# Patient Record
Sex: Female | Born: 1950 | Race: White | Hispanic: No | Marital: Married | Smoking: Never smoker
Health system: Southern US, Community
[De-identification: ages and names within clinical notes are randomized; demographics above are authoritative.]

## PROBLEM LIST (undated history)

## (undated) DIAGNOSIS — C4491 Basal cell carcinoma of skin, unspecified: Secondary | ICD-10-CM

## (undated) DIAGNOSIS — N809 Endometriosis, unspecified: Secondary | ICD-10-CM

## (undated) DIAGNOSIS — E079 Disorder of thyroid, unspecified: Secondary | ICD-10-CM

## (undated) HISTORY — PX: UMBILICAL HERNIA REPAIR: SHX196

## (undated) HISTORY — DX: Endometriosis, unspecified: N80.9

## (undated) HISTORY — PX: LAPAROSCOPY: SHX197

## (undated) HISTORY — DX: Disorder of thyroid, unspecified: E07.9

## (undated) HISTORY — PX: TOTAL ABDOMINAL HYSTERECTOMY: SHX209

## (undated) HISTORY — DX: Basal cell carcinoma of skin, unspecified: C44.91

---

## 2015-08-07 DIAGNOSIS — Z299 Encounter for prophylactic measures, unspecified: Secondary | ICD-10-CM | POA: Diagnosis not present

## 2015-08-07 DIAGNOSIS — J329 Chronic sinusitis, unspecified: Secondary | ICD-10-CM | POA: Diagnosis not present

## 2015-08-07 DIAGNOSIS — Z789 Other specified health status: Secondary | ICD-10-CM | POA: Diagnosis not present

## 2015-08-16 DIAGNOSIS — M810 Age-related osteoporosis without current pathological fracture: Secondary | ICD-10-CM | POA: Diagnosis not present

## 2015-10-26 DIAGNOSIS — D2271 Melanocytic nevi of right lower limb, including hip: Secondary | ICD-10-CM | POA: Diagnosis not present

## 2015-10-26 DIAGNOSIS — N952 Postmenopausal atrophic vaginitis: Secondary | ICD-10-CM | POA: Diagnosis not present

## 2015-10-26 DIAGNOSIS — Z08 Encounter for follow-up examination after completed treatment for malignant neoplasm: Secondary | ICD-10-CM | POA: Diagnosis not present

## 2015-10-26 DIAGNOSIS — I8390 Asymptomatic varicose veins of unspecified lower extremity: Secondary | ICD-10-CM | POA: Diagnosis not present

## 2015-10-26 DIAGNOSIS — D225 Melanocytic nevi of trunk: Secondary | ICD-10-CM | POA: Diagnosis not present

## 2015-10-26 DIAGNOSIS — Z85828 Personal history of other malignant neoplasm of skin: Secondary | ICD-10-CM | POA: Diagnosis not present

## 2015-10-26 DIAGNOSIS — D2261 Melanocytic nevi of right upper limb, including shoulder: Secondary | ICD-10-CM | POA: Diagnosis not present

## 2015-10-26 DIAGNOSIS — L821 Other seborrheic keratosis: Secondary | ICD-10-CM | POA: Diagnosis not present

## 2015-10-26 DIAGNOSIS — D2272 Melanocytic nevi of left lower limb, including hip: Secondary | ICD-10-CM | POA: Diagnosis not present

## 2015-10-26 DIAGNOSIS — M810 Age-related osteoporosis without current pathological fracture: Secondary | ICD-10-CM | POA: Diagnosis not present

## 2015-10-26 DIAGNOSIS — D2262 Melanocytic nevi of left upper limb, including shoulder: Secondary | ICD-10-CM | POA: Diagnosis not present

## 2015-10-26 DIAGNOSIS — Z1231 Encounter for screening mammogram for malignant neoplasm of breast: Secondary | ICD-10-CM | POA: Diagnosis not present

## 2015-10-26 DIAGNOSIS — L814 Other melanin hyperpigmentation: Secondary | ICD-10-CM | POA: Diagnosis not present

## 2015-11-22 DIAGNOSIS — J329 Chronic sinusitis, unspecified: Secondary | ICD-10-CM | POA: Diagnosis not present

## 2015-11-22 DIAGNOSIS — Z299 Encounter for prophylactic measures, unspecified: Secondary | ICD-10-CM | POA: Diagnosis not present

## 2015-11-22 DIAGNOSIS — Z6822 Body mass index (BMI) 22.0-22.9, adult: Secondary | ICD-10-CM | POA: Diagnosis not present

## 2015-11-22 DIAGNOSIS — Z713 Dietary counseling and surveillance: Secondary | ICD-10-CM | POA: Diagnosis not present

## 2015-12-04 DIAGNOSIS — Z1211 Encounter for screening for malignant neoplasm of colon: Secondary | ICD-10-CM | POA: Diagnosis not present

## 2015-12-04 DIAGNOSIS — Z0000 Encounter for general adult medical examination without abnormal findings: Secondary | ICD-10-CM | POA: Diagnosis not present

## 2015-12-04 DIAGNOSIS — Z7189 Other specified counseling: Secondary | ICD-10-CM | POA: Diagnosis not present

## 2015-12-05 DIAGNOSIS — E7800 Pure hypercholesterolemia, unspecified: Secondary | ICD-10-CM | POA: Diagnosis not present

## 2015-12-05 DIAGNOSIS — Z79899 Other long term (current) drug therapy: Secondary | ICD-10-CM | POA: Diagnosis not present

## 2015-12-05 DIAGNOSIS — M810 Age-related osteoporosis without current pathological fracture: Secondary | ICD-10-CM | POA: Diagnosis not present

## 2015-12-05 DIAGNOSIS — R5383 Other fatigue: Secondary | ICD-10-CM | POA: Diagnosis not present

## 2015-12-05 DIAGNOSIS — E039 Hypothyroidism, unspecified: Secondary | ICD-10-CM | POA: Diagnosis not present

## 2016-01-18 DIAGNOSIS — M810 Age-related osteoporosis without current pathological fracture: Secondary | ICD-10-CM | POA: Diagnosis not present

## 2016-03-25 DIAGNOSIS — E039 Hypothyroidism, unspecified: Secondary | ICD-10-CM | POA: Diagnosis not present

## 2016-05-17 DIAGNOSIS — Z299 Encounter for prophylactic measures, unspecified: Secondary | ICD-10-CM | POA: Diagnosis not present

## 2016-05-17 DIAGNOSIS — Z713 Dietary counseling and surveillance: Secondary | ICD-10-CM | POA: Diagnosis not present

## 2016-05-17 DIAGNOSIS — Z6823 Body mass index (BMI) 23.0-23.9, adult: Secondary | ICD-10-CM | POA: Diagnosis not present

## 2016-05-17 DIAGNOSIS — L259 Unspecified contact dermatitis, unspecified cause: Secondary | ICD-10-CM | POA: Diagnosis not present

## 2016-07-22 DIAGNOSIS — M810 Age-related osteoporosis without current pathological fracture: Secondary | ICD-10-CM | POA: Diagnosis not present

## 2016-08-06 DIAGNOSIS — H01002 Unspecified blepharitis right lower eyelid: Secondary | ICD-10-CM | POA: Diagnosis not present

## 2016-08-06 DIAGNOSIS — H04121 Dry eye syndrome of right lacrimal gland: Secondary | ICD-10-CM | POA: Diagnosis not present

## 2016-08-06 DIAGNOSIS — H00021 Hordeolum internum right upper eyelid: Secondary | ICD-10-CM | POA: Diagnosis not present

## 2016-08-06 DIAGNOSIS — H00022 Hordeolum internum right lower eyelid: Secondary | ICD-10-CM | POA: Diagnosis not present

## 2016-08-06 DIAGNOSIS — H02052 Trichiasis without entropian right lower eyelid: Secondary | ICD-10-CM | POA: Diagnosis not present

## 2016-08-06 DIAGNOSIS — H01001 Unspecified blepharitis right upper eyelid: Secondary | ICD-10-CM | POA: Diagnosis not present

## 2016-08-12 DIAGNOSIS — H0011 Chalazion right upper eyelid: Secondary | ICD-10-CM | POA: Diagnosis not present

## 2016-08-12 DIAGNOSIS — H04123 Dry eye syndrome of bilateral lacrimal glands: Secondary | ICD-10-CM | POA: Diagnosis not present

## 2016-08-12 DIAGNOSIS — H01001 Unspecified blepharitis right upper eyelid: Secondary | ICD-10-CM | POA: Diagnosis not present

## 2016-08-12 DIAGNOSIS — H02052 Trichiasis without entropian right lower eyelid: Secondary | ICD-10-CM | POA: Diagnosis not present

## 2016-08-12 DIAGNOSIS — H01002 Unspecified blepharitis right lower eyelid: Secondary | ICD-10-CM | POA: Diagnosis not present

## 2016-08-14 DIAGNOSIS — H01009 Unspecified blepharitis unspecified eye, unspecified eyelid: Secondary | ICD-10-CM | POA: Diagnosis not present

## 2016-08-14 DIAGNOSIS — H18899 Other specified disorders of cornea, unspecified eye: Secondary | ICD-10-CM | POA: Diagnosis not present

## 2016-08-14 DIAGNOSIS — H04123 Dry eye syndrome of bilateral lacrimal glands: Secondary | ICD-10-CM | POA: Diagnosis not present

## 2016-08-23 DIAGNOSIS — H04123 Dry eye syndrome of bilateral lacrimal glands: Secondary | ICD-10-CM | POA: Diagnosis not present

## 2016-08-23 DIAGNOSIS — H18899 Other specified disorders of cornea, unspecified eye: Secondary | ICD-10-CM | POA: Diagnosis not present

## 2016-08-23 DIAGNOSIS — H01009 Unspecified blepharitis unspecified eye, unspecified eyelid: Secondary | ICD-10-CM | POA: Diagnosis not present

## 2016-09-23 DIAGNOSIS — H01009 Unspecified blepharitis unspecified eye, unspecified eyelid: Secondary | ICD-10-CM | POA: Diagnosis not present

## 2016-09-23 DIAGNOSIS — H04123 Dry eye syndrome of bilateral lacrimal glands: Secondary | ICD-10-CM | POA: Diagnosis not present

## 2016-10-15 DIAGNOSIS — H029 Unspecified disorder of eyelid: Secondary | ICD-10-CM | POA: Diagnosis not present

## 2016-10-15 DIAGNOSIS — H01002 Unspecified blepharitis right lower eyelid: Secondary | ICD-10-CM | POA: Diagnosis not present

## 2016-10-15 DIAGNOSIS — H02052 Trichiasis without entropian right lower eyelid: Secondary | ICD-10-CM | POA: Diagnosis not present

## 2016-11-18 DIAGNOSIS — H01002 Unspecified blepharitis right lower eyelid: Secondary | ICD-10-CM | POA: Diagnosis not present

## 2016-11-18 DIAGNOSIS — H029 Unspecified disorder of eyelid: Secondary | ICD-10-CM | POA: Diagnosis not present

## 2016-11-18 DIAGNOSIS — H04123 Dry eye syndrome of bilateral lacrimal glands: Secondary | ICD-10-CM | POA: Diagnosis not present

## 2016-12-13 DIAGNOSIS — N952 Postmenopausal atrophic vaginitis: Secondary | ICD-10-CM | POA: Diagnosis not present

## 2016-12-13 DIAGNOSIS — Z90711 Acquired absence of uterus with remaining cervical stump: Secondary | ICD-10-CM | POA: Diagnosis not present

## 2016-12-13 DIAGNOSIS — Z803 Family history of malignant neoplasm of breast: Secondary | ICD-10-CM | POA: Diagnosis not present

## 2016-12-13 DIAGNOSIS — Z124 Encounter for screening for malignant neoplasm of cervix: Secondary | ICD-10-CM | POA: Diagnosis not present

## 2016-12-13 DIAGNOSIS — Z1231 Encounter for screening mammogram for malignant neoplasm of breast: Secondary | ICD-10-CM | POA: Diagnosis not present

## 2016-12-13 DIAGNOSIS — Z01419 Encounter for gynecological examination (general) (routine) without abnormal findings: Secondary | ICD-10-CM | POA: Diagnosis not present

## 2016-12-20 DIAGNOSIS — Z79899 Other long term (current) drug therapy: Secondary | ICD-10-CM | POA: Diagnosis not present

## 2016-12-20 DIAGNOSIS — Z7189 Other specified counseling: Secondary | ICD-10-CM | POA: Diagnosis not present

## 2016-12-20 DIAGNOSIS — Z1211 Encounter for screening for malignant neoplasm of colon: Secondary | ICD-10-CM | POA: Diagnosis not present

## 2016-12-20 DIAGNOSIS — Z6823 Body mass index (BMI) 23.0-23.9, adult: Secondary | ICD-10-CM | POA: Diagnosis not present

## 2016-12-20 DIAGNOSIS — Z789 Other specified health status: Secondary | ICD-10-CM | POA: Diagnosis not present

## 2016-12-20 DIAGNOSIS — Z1339 Encounter for screening examination for other mental health and behavioral disorders: Secondary | ICD-10-CM | POA: Diagnosis not present

## 2016-12-20 DIAGNOSIS — Z90710 Acquired absence of both cervix and uterus: Secondary | ICD-10-CM | POA: Diagnosis not present

## 2016-12-20 DIAGNOSIS — E039 Hypothyroidism, unspecified: Secondary | ICD-10-CM | POA: Diagnosis not present

## 2016-12-20 DIAGNOSIS — Z299 Encounter for prophylactic measures, unspecified: Secondary | ICD-10-CM | POA: Diagnosis not present

## 2016-12-20 DIAGNOSIS — Z0000 Encounter for general adult medical examination without abnormal findings: Secondary | ICD-10-CM | POA: Diagnosis not present

## 2016-12-20 DIAGNOSIS — Z1331 Encounter for screening for depression: Secondary | ICD-10-CM | POA: Diagnosis not present

## 2016-12-21 DIAGNOSIS — Z23 Encounter for immunization: Secondary | ICD-10-CM | POA: Diagnosis not present

## 2016-12-23 DIAGNOSIS — D2261 Melanocytic nevi of right upper limb, including shoulder: Secondary | ICD-10-CM | POA: Diagnosis not present

## 2016-12-23 DIAGNOSIS — D225 Melanocytic nevi of trunk: Secondary | ICD-10-CM | POA: Diagnosis not present

## 2016-12-23 DIAGNOSIS — D2271 Melanocytic nevi of right lower limb, including hip: Secondary | ICD-10-CM | POA: Diagnosis not present

## 2016-12-23 DIAGNOSIS — B351 Tinea unguium: Secondary | ICD-10-CM | POA: Diagnosis not present

## 2016-12-23 DIAGNOSIS — Z08 Encounter for follow-up examination after completed treatment for malignant neoplasm: Secondary | ICD-10-CM | POA: Diagnosis not present

## 2016-12-23 DIAGNOSIS — Z85828 Personal history of other malignant neoplasm of skin: Secondary | ICD-10-CM | POA: Diagnosis not present

## 2016-12-23 DIAGNOSIS — D2262 Melanocytic nevi of left upper limb, including shoulder: Secondary | ICD-10-CM | POA: Diagnosis not present

## 2016-12-23 DIAGNOSIS — L814 Other melanin hyperpigmentation: Secondary | ICD-10-CM | POA: Diagnosis not present

## 2016-12-23 DIAGNOSIS — L821 Other seborrheic keratosis: Secondary | ICD-10-CM | POA: Diagnosis not present

## 2016-12-23 DIAGNOSIS — D2272 Melanocytic nevi of left lower limb, including hip: Secondary | ICD-10-CM | POA: Diagnosis not present

## 2017-01-23 DIAGNOSIS — M810 Age-related osteoporosis without current pathological fracture: Secondary | ICD-10-CM | POA: Diagnosis not present

## 2017-02-11 DIAGNOSIS — L718 Other rosacea: Secondary | ICD-10-CM | POA: Diagnosis not present

## 2017-02-11 DIAGNOSIS — L738 Other specified follicular disorders: Secondary | ICD-10-CM | POA: Diagnosis not present

## 2017-08-04 DIAGNOSIS — M810 Age-related osteoporosis without current pathological fracture: Secondary | ICD-10-CM | POA: Diagnosis not present

## 2017-08-15 DIAGNOSIS — E039 Hypothyroidism, unspecified: Secondary | ICD-10-CM | POA: Diagnosis not present

## 2017-08-15 DIAGNOSIS — T1490XA Injury, unspecified, initial encounter: Secondary | ICD-10-CM | POA: Diagnosis not present

## 2017-08-15 DIAGNOSIS — M25572 Pain in left ankle and joints of left foot: Secondary | ICD-10-CM | POA: Diagnosis not present

## 2017-08-15 DIAGNOSIS — S99912A Unspecified injury of left ankle, initial encounter: Secondary | ICD-10-CM | POA: Diagnosis not present

## 2017-08-15 DIAGNOSIS — Z299 Encounter for prophylactic measures, unspecified: Secondary | ICD-10-CM | POA: Diagnosis not present

## 2017-08-15 DIAGNOSIS — R0781 Pleurodynia: Secondary | ICD-10-CM | POA: Diagnosis not present

## 2017-08-15 DIAGNOSIS — Z6824 Body mass index (BMI) 24.0-24.9, adult: Secondary | ICD-10-CM | POA: Diagnosis not present

## 2017-08-15 DIAGNOSIS — M79672 Pain in left foot: Secondary | ICD-10-CM | POA: Diagnosis not present

## 2017-09-01 DIAGNOSIS — M9902 Segmental and somatic dysfunction of thoracic region: Secondary | ICD-10-CM | POA: Diagnosis not present

## 2017-09-01 DIAGNOSIS — S233XXA Sprain of ligaments of thoracic spine, initial encounter: Secondary | ICD-10-CM | POA: Diagnosis not present

## 2017-09-01 DIAGNOSIS — S2341XA Sprain of ribs, initial encounter: Secondary | ICD-10-CM | POA: Diagnosis not present

## 2017-09-03 DIAGNOSIS — S2341XA Sprain of ribs, initial encounter: Secondary | ICD-10-CM | POA: Diagnosis not present

## 2017-09-03 DIAGNOSIS — M9902 Segmental and somatic dysfunction of thoracic region: Secondary | ICD-10-CM | POA: Diagnosis not present

## 2017-09-03 DIAGNOSIS — S233XXA Sprain of ligaments of thoracic spine, initial encounter: Secondary | ICD-10-CM | POA: Diagnosis not present

## 2017-09-04 DIAGNOSIS — S2341XA Sprain of ribs, initial encounter: Secondary | ICD-10-CM | POA: Diagnosis not present

## 2017-09-04 DIAGNOSIS — S233XXA Sprain of ligaments of thoracic spine, initial encounter: Secondary | ICD-10-CM | POA: Diagnosis not present

## 2017-09-04 DIAGNOSIS — M9902 Segmental and somatic dysfunction of thoracic region: Secondary | ICD-10-CM | POA: Diagnosis not present

## 2017-09-12 ENCOUNTER — Ambulatory Visit (HOSPITAL_BASED_OUTPATIENT_CLINIC_OR_DEPARTMENT_OTHER)
Admission: RE | Admit: 2017-09-12 | Discharge: 2017-09-12 | Disposition: A | Discharge status: Home / Self Care | Payer: Medicare Other | Source: Ambulatory Visit | Attending: Family Medicine | Admitting: Family Medicine

## 2017-09-12 ENCOUNTER — Encounter: Payer: Self-pay | Admitting: Family Medicine

## 2017-09-12 ENCOUNTER — Ambulatory Visit (INDEPENDENT_AMBULATORY_CARE_PROVIDER_SITE_OTHER): Payer: Medicare Other | Admitting: Family Medicine

## 2017-09-12 VITALS — BP 118/77 | HR 71 | Wt 140.0 lb

## 2017-09-12 DIAGNOSIS — M79672 Pain in left foot: Secondary | ICD-10-CM | POA: Diagnosis not present

## 2017-09-12 DIAGNOSIS — M7989 Other specified soft tissue disorders: Secondary | ICD-10-CM | POA: Insufficient documentation

## 2017-09-12 MED ORDER — DICLOFENAC SODIUM 1 % TD GEL: 2.0000 g | Freq: Four times a day (QID) | TRANSDERMAL | 11 refills | Status: AC

## 2017-09-12 NOTE — Progress Notes (Addendum)
Subjective:    CC: foot swelling  HPI: Julanne notes a 6-week history of left foot and ankle swelling and pain.  She has a history of left plantar heel pain several years ago that was thought to be plantar fasciitis.  This resolved with home exercises and orthotics.  She notes that 6 weeks ago she started having some pain in the plantar aspect of her calcaneus somewhat consistent with plantar fasciitis however she additionally developed ankle and midfoot swelling dorsally midfoot and both medially and laterally into the ankle.  She notes this is mildly tender and not consistent with prior episodes of plantar fasciitis.  She denies any injury or significant change in her activity.  She is had some work-up for this already with a normal-appearing x-ray obtained by her PCP at [facility] on August 9.  Additionally she is had some trials of over-the-counter medications which have not helped much.  Additionally her PCP obtained a reportedly normal uric acid.  She denies fevers chills nausea vomiting or diarrhea.  Past medical history, Surgical history, Family history not pertinant except as noted below, Social history, Allergies, and medications have been entered into the medical record, reviewed, and no changes needed.   Review of Systems: No headache, visual changes, nausea, vomiting, diarrhea, constipation, dizziness, abdominal pain, skin rash, fevers, chills, night sweats, weight loss, swollen lymph nodes, body aches, joint swelling, muscle aches, chest pain, shortness of breath, mood changes, visual or auditory hallucinations.   Objective:    Vitals:   09/12/17 1045  BP: 118/77  Pulse: 71   General: Well Developed, well nourished, and in no acute distress.  Neuro/Psych: Alert and oriented x3, extra-ocular muscles intact, able to move all 4 extremities, sensation grossly intact. Skin: Warm and dry, no rashes noted.  Respiratory: Not using accessory muscles, speaking in full  sentences, trachea midline.  Cardiovascular: Pulses palpable, no extremity edema. Abdomen: Does not appear distended. MSK: Left foot and ankle mildly swollen especially at the medial lateral aspect of the midfoot and ankle. Foot and ankle motion are normal. Stable ligaments exam. Mildly tender to palpation plantar calcaneus.  Additionally mildly tender to palpation at the dorsal and lateral midfoot and at the medial ankle. Pulses capillary refill and sensation are intact distally.  Lab and Radiology Results X-ray report from outside hospital reviewed.      Limited musculoskeletal ultrasound of the left foot reveals a moderate ankle effusion as well as edema with hypoechoic fluid marbling and tracking along the subcutaneous tissue especially at the medial ankle.  Posterior tibialis tendons peroneal tendons and Achilles tendon are normal-appearing on ultrasound. Normal bony structures.   Impression and Recommendations:    Assessment and Plan: 67 y.o. female with  Left foot pain and swelling.  Etiology is somewhat unclear.  X-rays were reportedly normal.  DVT is an obvious possibility and will proceed with duplex ultrasound to evaluate for DVT.  Additionally as his symptoms now have been ongoing for 6 weeks and failing conservative management with initially negative work-up I think is reasonable to proceed with MRI at this point further characterize her ankle effusion and pain.  I am concerned for an osteochondral lesion or a possible radiographically occult fracture.  Follow-up after MRI.  Additionally will treat with diclofenac gel and ankle compression sleeve..   CC:  Nicanor Bake, NP [facility] [place], [place] [address] [phone] Fax: [phone]   Orders Placed This Encounter  Procedures  . US Venous Img  Lower Unilateral Left    Standing Status:   Future    Standing Expiration Date:   11/13/2018    Order Specific Question:   Reason for Exam  (SYMPTOM  OR DIAGNOSIS REQUIRED)    Answer:   eval leg swelling    Order Specific Question:   Preferred imaging location?    Answer:   Designer, multimedia  . MR FOOT LEFT WO CONTRAST    Standing Status:   Future    Standing Expiration Date:   11/13/2018    Order Specific Question:   What is the patient's sedation requirement?    Answer:   No Sedation    Order Specific Question:   Does the patient have a pacemaker or implanted devices?    Answer:   No    Order Specific Question:   Preferred imaging location?    Answer:   Product/process development scientist (table limit-350lbs)    Order Specific Question:   Radiology Contrast Protocol - do NOT remove file path    Answer:   [email or web address]  . US Venous Img Lower Unilateral Left    Standing Status:   Future    Standing Expiration Date:   11/13/2018    Scheduling Instructions:     [facility] hospital    Order Specific Question:   Reason for Exam (SYMPTOM  OR DIAGNOSIS REQUIRED)    Answer:   eval leg swelling    Order Specific Question:   Preferred imaging location?    Answer:   External    Order Specific Question:   Call Results- Best Contact Number?    Answer:   Fax results to Dr Georgina Snell [phone]   Meds ordered this encounter  Medications  . diclofenac sodium (VOLTAREN) 1 % GEL    Sig: Apply 2 g topically 4 (four) times daily. To affected joint.    Dispense:  100 g    Refill:  11    Discussed warning signs or symptoms. Please see discharge instructions. Patient expresses understanding.

## 2017-09-12 NOTE — Progress Notes (Signed)
Faxed and confirmation received.

## 2017-09-12 NOTE — Patient Instructions (Signed)
Thank you for coming in today. Schedule Ultrasound Call [phone]  You should hear about MRI soon.   Use body helix full ankle compression sleeve.   Use diclofenac gel.    Follow up after MRI

## 2017-09-15 ENCOUNTER — Ambulatory Visit (INDEPENDENT_AMBULATORY_CARE_PROVIDER_SITE_OTHER): Payer: Medicare Other

## 2017-09-15 DIAGNOSIS — M7672 Peroneal tendinitis, left leg: Secondary | ICD-10-CM | POA: Diagnosis not present

## 2017-09-15 DIAGNOSIS — R600 Localized edema: Secondary | ICD-10-CM | POA: Diagnosis not present

## 2017-09-15 DIAGNOSIS — M722 Plantar fascial fibromatosis: Secondary | ICD-10-CM | POA: Diagnosis not present

## 2017-09-15 DIAGNOSIS — M7989 Other specified soft tissue disorders: Secondary | ICD-10-CM

## 2017-09-15 DIAGNOSIS — M7662 Achilles tendinitis, left leg: Secondary | ICD-10-CM

## 2017-09-17 ENCOUNTER — Ambulatory Visit (INDEPENDENT_AMBULATORY_CARE_PROVIDER_SITE_OTHER): Payer: Medicare Other | Admitting: Family Medicine

## 2017-09-17 ENCOUNTER — Encounter: Payer: Self-pay | Admitting: Family Medicine

## 2017-09-17 VITALS — BP 131/77 | HR 72 | Ht 63.0 in | Wt 141.0 lb

## 2017-09-17 DIAGNOSIS — M79672 Pain in left foot: Secondary | ICD-10-CM | POA: Diagnosis not present

## 2017-09-17 DIAGNOSIS — M7989 Other specified soft tissue disorders: Secondary | ICD-10-CM | POA: Diagnosis not present

## 2017-09-17 NOTE — Progress Notes (Signed)
Christine Wolfe is a 67 y.o. female who presents to [place] today for follow-up left foot and leg swelling.  Patient was seen on September 6 for left ankle and leg swelling.  At that point she was failing conservative management and we proceeded with noncontrast MRI and duplex ultrasound to evaluate for possible DVT.  Fortunately the ultrasound was negative.  The MRI showed severe plantar fasciitis with either complete tear or nearly complete tear.  Additionally patient had mild peroneal tendinitis with tiny longitudinal split tear and mild Achilles tendinitis.  Fortunately she did not have stress fractures fractures OCD lesion or other significant findings.  She notes that initially she did have quite a bit of plantar heel pain however that largely resolved in the last few weeks.  She continues to have diffuse medial and lateral ankle pain as well as dorsal midfoot pain and swelling.  She notes this is mild to moderate.  She was prescribed diclofenac gel which has been somewhat helpful.  Additionally she was recommended to use a compression ankle sleeve which she is ordered but not yet got.    ROS:  As above  Exam:  BP 131/77   Pulse 72   Ht 5\' 3"  (1.6 m)   Wt 141 lb (64 kg)   BMI 24.98 kg/m  General: Well Developed, well nourished, and in no acute distress.  Neuro/Psych: Alert and oriented x3, extra-ocular muscles intact, able to move all 4 extremities, sensation grossly intact. Skin: Warm and dry, no rashes noted.  Respiratory: Not using accessory muscles, speaking in full sentences, trachea midline.  Cardiovascular: Pulses palpable, no extremity edema. Abdomen: Does not appear distended. MSK:  Left foot and ankle: Slightly swollen at the lateral medial ankle as well as the dorsal midfoot. Ankle and foot motion are intact and normal. Stable ligamentous exam. Mildly tender to palpation at the medial to lateral ankle anteriorly.  Not  particularly tender at the posterior aspect of the lateral malleolus at the peroneal tendon.  Additionally not particularly tender at the posterior calcaneus at the Achilles tendon. Also not very tender at the plantar calcaneus at the origin of the plantar fascia. Pulses capillary refill and sensation are intact distally.    Lab and Radiology Results No results found for this or any previous visit (from the past 72 hour(s)). Mr Foot Left Wo Contrast  Result Date: 09/15/2017 CLINICAL DATA:  Left foot pain. Normal range of motion. Swelling of the ankle and midfoot since July. EXAM: MRI OF THE LEFT FOOT WITHOUT CONTRAST TECHNIQUE: Multiplanar, multisequence MR imaging of the left foot was performed. No intravenous contrast was administered. COMPARISON:  None. FINDINGS: TENDONS Peroneal: Peroneal longus tendon intact. Mild tendinosis of the peroneus brevis with a longitudinal split tear. Posteromedial: Posterior tibial tendon intact. Flexor hallucis longus tendon intact. Flexor digitorum longus tendon intact. Anterior: Tibialis anterior tendon intact. Extensor hallucis longus tendon intact Extensor digitorum longus tendon intact. Achilles: Minimal tendinosis of the distal medial Achilles tendon without a tear. Severe soft tissue edema in Kager's fat. Plantar Fascia: Severe thickening of the medial band of the plantar fascia adjacent to the calcaneal insertion with subcortical reactive marrow edema. Large high-grade partial thickness versus complete tear of the medial band of the plantar fascia adjacent to the calcaneal insertion. Mild adjacent soft tissue edema. Mild edema in the flexor digitorum brevis muscle. LIGAMENTS Lateral: Anterior talofibular ligament intact. Calcaneofibular ligament intact. Posterior talofibular ligament intact. Anterior and posterior tibiofibular ligaments intact. Medial:  Deltoid ligament intact. Spring ligament intact. CARTILAGE Ankle Joint: No joint effusion. Normal ankle mortise.  No chondral defect. Subtalar Joints/Sinus Tarsi: Normal subtalar joints. No subtalar joint effusion. Normal sinus tarsi. Bones: No marrow signal abnormality.  No fracture or dislocation. Soft Tissue: No fluid collection or hematoma. Remainder of the muscles are normal. IMPRESSION: 1. Severe plantar fasciitis involving the medial band of the plantar fascia with a large high-grade partial versus complete tear at the calcaneal insertion. Mild subcortical marrow edema at the calcaneal insertion. 2. Mild tendinosis of the peroneus brevis with a longitudinal split tear. 3. Minimal tendinosis of the distal medial Achilles tendon without a tear. Electronically Signed   By: Kathreen Devoid   On: 09/15/2017 11:06    I personally (independently) visualized and performed the interpretation of the images attached in this note.  EXAM: LEFT LOWER EXTREMITY VENOUS DOPPLER ULTRASOUND  TECHNIQUE: Gray-scale sonography with graded compression, as well as color Doppler and duplex ultrasound were performed to evaluate the lower extremity deep venous systems from the level of the common femoral vein and including the common femoral, femoral, profunda femoral, popliteal and calf veins including the posterior tibial, peroneal and gastrocnemius veins when visible. The superficial great saphenous vein was also interrogated. Spectral Doppler was utilized to evaluate flow at rest and with distal augmentation maneuvers in the common femoral, femoral and popliteal veins.  COMPARISON:  None.  FINDINGS: Contralateral Common Femoral Vein: Respiratory phasicity is normal and symmetric with the symptomatic side. No evidence of thrombus. Normal compressibility.  Common Femoral Vein: No evidence of thrombus. Normal compressibility, respiratory phasicity and response to augmentation.  Saphenofemoral Junction: No evidence of thrombus. Normal compressibility and flow on color Doppler imaging.  Profunda Femoral Vein: No  evidence of thrombus. Normal compressibility and flow on color Doppler imaging.  Femoral Vein: No evidence of thrombus. Normal compressibility, respiratory phasicity and response to augmentation.  Popliteal Vein: No evidence of thrombus. Normal compressibility, respiratory phasicity and response to augmentation.  Calf Veins: No evidence of thrombus. Normal compressibility and flow on color Doppler imaging.  Superficial Great Saphenous Vein: No evidence of thrombus. Normal compressibility and flow on color Doppler imaging.  Other Findings:  None.  IMPRESSION: Sonographic survey of the left lower extremity negative for DVT.   Electronically Signed   By: Corrie Mckusick D.O.   On: 09/13/2017 05:16   Assessment and Plan: 67 y.o. female with  Left foot swelling.  Patient has significant plantar fasciitis and I suspect a complete plantar fascial tear.  This could explain some of her swelling but likely does not explain her dorsal midfoot swelling.  Fortunately she is effectively done the definitive surgical treatment for plantar fasciitis herself with having a complete tear.  She is not particularly tender at the plantar calcaneus and I think the plantar fasciitis changes seen on MRI are likely noncontributory.  Additionally she has mild peroneal tendinitis with a longitudinal split tear and mild Achilles tendinitis without tear.  These are also likely not particularly contributory as she is not very tender in this area.  Plan for continued compression sleeve, as well as adding eccentric exercises for peroneal tendinitis and Achilles tendinitis.  Continue diclofenac gel.  Additionally patient likely has venous reflux as a contributor to her leg swelling.  We discussed the possibility of doing a venous reflux study although I do not think there is going to be much recommendations with that study aside from compression sleeve.  Recommend TED hose or compression sleeve.  Recheck in 6  weeks or so.  Return sooner if needed.   CC: Nicanor Bake C  I spent 25 minutes with this patient, greater than 50% was face-to-face time counseling regarding ddx and plan.  Historical information moved to improve visibility of documentation.  Past Medical History:  Diagnosis Date  . Basal cell carcinoma    on face  . BCC (basal cell carcinoma of skin)   . Endometriosis   . Thyroid disease    Past Surgical History:  Procedure Laterality Date  . LAPAROSCOPY    . TOTAL ABDOMINAL HYSTERECTOMY    . UMBILICAL HERNIA REPAIR     Social History   Tobacco Use  . Smoking status: Never Smoker  . Smokeless tobacco: Never Used  Substance Use Topics  . Alcohol use: Never    Frequency: Never   family history is not on file.  Medications: Current Outpatient Medications  Medication Sig Dispense Refill  . denosumab (PROLIA) 60 MG/ML SOSY injection Inject 60 mg into the skin every 6 (six) months.    . diclofenac sodium (VOLTAREN) 1 % GEL Apply 2 g topically 4 (four) times daily. To affected joint. 100 g 11  . levothyroxine (SYNTHROID, LEVOTHROID) 50 MCG tablet TAKE 1 TABLET BY MOUTH ONCE DAILY NEED APPOINTMENT  1   No current facility-administered medications for this visit.    No Known Allergies    Discussed warning signs or symptoms. Please see discharge instructions. Patient expresses understanding.

## 2017-09-17 NOTE — Patient Instructions (Addendum)
Thank you for coming in today. Use the ankle sleeve and consider compression stocking.  Use the gel topically.  Do the ankle exercises.   Ok to resume exercise.   Do the band exercises.,  Foot Down Foot out Do about 30 reps 2-3x daily.   Recheck with me in 6 weeks.  Return sooner if needed.

## 2017-09-25 DIAGNOSIS — Z713 Dietary counseling and surveillance: Secondary | ICD-10-CM | POA: Diagnosis not present

## 2017-09-25 DIAGNOSIS — Z6824 Body mass index (BMI) 24.0-24.9, adult: Secondary | ICD-10-CM | POA: Diagnosis not present

## 2017-09-25 DIAGNOSIS — Z789 Other specified health status: Secondary | ICD-10-CM | POA: Diagnosis not present

## 2017-09-25 DIAGNOSIS — Z299 Encounter for prophylactic measures, unspecified: Secondary | ICD-10-CM | POA: Diagnosis not present

## 2017-09-25 DIAGNOSIS — E039 Hypothyroidism, unspecified: Secondary | ICD-10-CM | POA: Diagnosis not present

## 2017-10-31 ENCOUNTER — Ambulatory Visit: Payer: Medicare Other | Admitting: Family Medicine

## 2017-11-13 ENCOUNTER — Ambulatory Visit: Payer: Medicare Other | Admitting: Family Medicine

## 2017-11-21 ENCOUNTER — Ambulatory Visit (INDEPENDENT_AMBULATORY_CARE_PROVIDER_SITE_OTHER): Payer: Medicare Other | Admitting: Family Medicine

## 2017-11-21 VITALS — BP 141/86 | HR 91 | Ht 64.0 in | Wt 141.0 lb

## 2017-11-21 DIAGNOSIS — M7989 Other specified soft tissue disorders: Secondary | ICD-10-CM

## 2017-11-21 DIAGNOSIS — M79672 Pain in left foot: Secondary | ICD-10-CM | POA: Diagnosis not present

## 2017-11-21 NOTE — Patient Instructions (Signed)
Thank you for coming in today. Continue compression sleeve.  Continue activity.  Advance walking. Start at 50% of pre-injury level and advance 10% per week.  Listen to your body.   Exercise.  Toe standing go from up position to down position slowly. Do 30 reps 2-3x daily.  Band exercise move foot from in to out slowly.  Do 30 reps 2-3x daily.   Recheck with me as needed.

## 2017-11-24 NOTE — Progress Notes (Signed)
Christine Wolfe is a 67 y.o. female who presents to [place] today for follow-up pain and swelling left leg.  Christine Wolfe was last seen on September 11.  At that time she had significant left foot pain and swelling.  She had MRI that showed severe plantar fasciitis with tear and mild peroneal tendinitis and mild Achilles tendinitis.  She had a trial of physical therapy and compression.  She notes that she is had a considerable improvement.  She is not fully improved but feels much better than she was 6 weeks ago.  She is been using compressive sleeves as well as TED hose and has feeling pretty happy.  She notes that she still has some soreness but has resumed exercise.  She has not started brisk walking again and would like to.  She continues exercises regularly.    ROS:  As above  Exam:  BP (!) 141/86   Pulse 91   Ht 5\' 4"  (1.626 m)   Wt 141 lb (64 kg)   BMI 24.20 kg/m  General: Well Developed, well nourished, and in no acute distress.  Neuro/Psych: Alert and oriented x3, extra-ocular muscles intact, able to move all 4 extremities, sensation grossly intact. Skin: Warm and dry, no rashes noted.  Respiratory: Not using accessory muscles, speaking in full sentences, trachea midline.  Cardiovascular: Pulses palpable, no extremity edema. Abdomen: Does not appear distended. MSK:  Left leg trace edema to mid calf. Left ankle normal-appearing normal motion minimally tender at the lateral midfoot along the course of the peroneal tendon. Normal motion pulses capillary fill and strength.    Lab and Radiology Results EXAM: MRI OF THE LEFT FOOT WITHOUT CONTRAST  TECHNIQUE: Multiplanar, multisequence MR imaging of the left foot was performed. No intravenous contrast was administered.  COMPARISON:  None.  FINDINGS: TENDONS  Peroneal: Peroneal longus tendon intact. Mild tendinosis of the peroneus brevis with a longitudinal split  tear.  Posteromedial: Posterior tibial tendon intact. Flexor hallucis longus tendon intact. Flexor digitorum longus tendon intact.  Anterior: Tibialis anterior tendon intact. Extensor hallucis longus tendon intact Extensor digitorum longus tendon intact.  Achilles: Minimal tendinosis of the distal medial Achilles tendon without a tear. Severe soft tissue edema in Kager's fat.  Plantar Fascia: Severe thickening of the medial band of the plantar fascia adjacent to the calcaneal insertion with subcortical reactive marrow edema. Large high-grade partial thickness versus complete tear of the medial band of the plantar fascia adjacent to the calcaneal insertion. Mild adjacent soft tissue edema. Mild edema in the flexor digitorum brevis muscle.  LIGAMENTS  Lateral: Anterior talofibular ligament intact. Calcaneofibular ligament intact. Posterior talofibular ligament intact. Anterior and posterior tibiofibular ligaments intact.  Medial: Deltoid ligament intact. Spring ligament intact.  CARTILAGE  Ankle Joint: No joint effusion. Normal ankle mortise. No chondral defect.  Subtalar Joints/Sinus Tarsi: Normal subtalar joints. No subtalar joint effusion. Normal sinus tarsi.  Bones: No marrow signal abnormality.  No fracture or dislocation.  Soft Tissue: No fluid collection or hematoma. Remainder of the muscles are normal.  IMPRESSION: 1. Severe plantar fasciitis involving the medial band of the plantar fascia with a large high-grade partial versus complete tear at the calcaneal insertion. Mild subcortical marrow edema at the calcaneal insertion. 2. Mild tendinosis of the peroneus brevis with a longitudinal split tear. 3. Minimal tendinosis of the distal medial Achilles tendon without a tear.   Electronically Signed   By: Kathreen Devoid   On: 09/15/2017 11:06 I personally (independently)  visualized and performed the interpretation of the images attached in this  note.     Assessment and Plan: 67 y.o. female with left foot pain and leg swelling.  Leg swelling likely multifactorial and could possibly include venous issue.  Suspect venous reflux.  Additionally patient has some peroneal tendinitis that is likely also a contributing factor.  Discussed options.  Patient is only minimally symptomatic and doing very well.  Plan to advance home exercise program to include eccentric exercise focused on Achilles tendon and peroneal tendons.  Continue compressive sleeves.  If not improving will continue work-up swelling including possible venous reflux study.    No orders of the defined types were placed in this encounter.  No orders of the defined types were placed in this encounter.   Historical information moved to improve visibility of documentation.  Past Medical History:  Diagnosis Date  . Basal cell carcinoma    on face  . BCC (basal cell carcinoma of skin)   . Endometriosis   . Thyroid disease    Past Surgical History:  Procedure Laterality Date  . LAPAROSCOPY    . TOTAL ABDOMINAL HYSTERECTOMY    . UMBILICAL HERNIA REPAIR     Social History   Tobacco Use  . Smoking status: Never Smoker  . Smokeless tobacco: Never Used  Substance Use Topics  . Alcohol use: Never    Frequency: Never   family history is not on file.  Medications: Current Outpatient Medications  Medication Sig Dispense Refill  . denosumab (PROLIA) 60 MG/ML SOSY injection Inject 60 mg into the skin every 6 (six) months.    . diclofenac sodium (VOLTAREN) 1 % GEL Apply 2 g topically 4 (four) times daily. To affected joint. 100 g 11  . levothyroxine (SYNTHROID, LEVOTHROID) 50 MCG tablet TAKE 1 TABLET BY MOUTH ONCE DAILY NEED APPOINTMENT  1   No current facility-administered medications for this visit.    No Known Allergies    Discussed warning signs or symptoms. Please see discharge instructions. Patient expresses understanding.

## 2017-12-12 DIAGNOSIS — R079 Chest pain, unspecified: Secondary | ICD-10-CM | POA: Diagnosis not present

## 2017-12-12 DIAGNOSIS — S2239XA Fracture of one rib, unspecified side, initial encounter for closed fracture: Secondary | ICD-10-CM | POA: Diagnosis not present

## 2017-12-12 DIAGNOSIS — S0993XA Unspecified injury of face, initial encounter: Secondary | ICD-10-CM | POA: Diagnosis not present

## 2017-12-12 DIAGNOSIS — S299XXA Unspecified injury of thorax, initial encounter: Secondary | ICD-10-CM | POA: Diagnosis not present

## 2017-12-12 DIAGNOSIS — S0990XA Unspecified injury of head, initial encounter: Secondary | ICD-10-CM | POA: Diagnosis not present

## 2017-12-12 DIAGNOSIS — S0003XA Contusion of scalp, initial encounter: Secondary | ICD-10-CM | POA: Diagnosis not present

## 2017-12-12 DIAGNOSIS — M542 Cervicalgia: Secondary | ICD-10-CM | POA: Diagnosis not present

## 2017-12-12 DIAGNOSIS — S199XXA Unspecified injury of neck, initial encounter: Secondary | ICD-10-CM | POA: Diagnosis not present

## 2017-12-12 DIAGNOSIS — S2231XA Fracture of one rib, right side, initial encounter for closed fracture: Secondary | ICD-10-CM | POA: Diagnosis not present

## 2017-12-12 DIAGNOSIS — Z79899 Other long term (current) drug therapy: Secondary | ICD-10-CM | POA: Diagnosis not present

## 2017-12-12 DIAGNOSIS — R5381 Other malaise: Secondary | ICD-10-CM | POA: Diagnosis not present

## 2017-12-22 DIAGNOSIS — S99912A Unspecified injury of left ankle, initial encounter: Secondary | ICD-10-CM | POA: Diagnosis not present

## 2017-12-22 DIAGNOSIS — M7989 Other specified soft tissue disorders: Secondary | ICD-10-CM | POA: Diagnosis not present

## 2017-12-22 DIAGNOSIS — S99911A Unspecified injury of right ankle, initial encounter: Secondary | ICD-10-CM | POA: Diagnosis not present

## 2017-12-22 DIAGNOSIS — M19071 Primary osteoarthritis, right ankle and foot: Secondary | ICD-10-CM | POA: Diagnosis not present

## 2017-12-22 DIAGNOSIS — S9031XA Contusion of right foot, initial encounter: Secondary | ICD-10-CM | POA: Diagnosis not present

## 2017-12-22 DIAGNOSIS — S9032XA Contusion of left foot, initial encounter: Secondary | ICD-10-CM | POA: Diagnosis not present

## 2017-12-22 DIAGNOSIS — R600 Localized edema: Secondary | ICD-10-CM | POA: Diagnosis not present

## 2017-12-22 DIAGNOSIS — M25572 Pain in left ankle and joints of left foot: Secondary | ICD-10-CM | POA: Diagnosis not present

## 2017-12-22 DIAGNOSIS — M19072 Primary osteoarthritis, left ankle and foot: Secondary | ICD-10-CM | POA: Diagnosis not present

## 2017-12-22 DIAGNOSIS — M25571 Pain in right ankle and joints of right foot: Secondary | ICD-10-CM | POA: Diagnosis not present

## 2018-01-25 DIAGNOSIS — Z23 Encounter for immunization: Secondary | ICD-10-CM | POA: Diagnosis not present

## 2018-01-26 DIAGNOSIS — S2231XD Fracture of one rib, right side, subsequent encounter for fracture with routine healing: Secondary | ICD-10-CM | POA: Diagnosis not present

## 2018-02-05 DIAGNOSIS — M810 Age-related osteoporosis without current pathological fracture: Secondary | ICD-10-CM | POA: Diagnosis not present

## 2018-03-09 DIAGNOSIS — L814 Other melanin hyperpigmentation: Secondary | ICD-10-CM | POA: Diagnosis not present

## 2018-03-09 DIAGNOSIS — H02721 Madarosis of right upper eyelid and periocular area: Secondary | ICD-10-CM | POA: Diagnosis not present

## 2018-03-09 DIAGNOSIS — D2262 Melanocytic nevi of left upper limb, including shoulder: Secondary | ICD-10-CM | POA: Diagnosis not present

## 2018-03-09 DIAGNOSIS — D2271 Melanocytic nevi of right lower limb, including hip: Secondary | ICD-10-CM | POA: Diagnosis not present

## 2018-03-09 DIAGNOSIS — L720 Epidermal cyst: Secondary | ICD-10-CM | POA: Diagnosis not present

## 2018-03-09 DIAGNOSIS — L650 Telogen effluvium: Secondary | ICD-10-CM | POA: Diagnosis not present

## 2018-03-09 DIAGNOSIS — D2272 Melanocytic nevi of left lower limb, including hip: Secondary | ICD-10-CM | POA: Diagnosis not present

## 2018-03-09 DIAGNOSIS — H02724 Madarosis of left upper eyelid and periocular area: Secondary | ICD-10-CM | POA: Diagnosis not present

## 2018-03-09 DIAGNOSIS — L821 Other seborrheic keratosis: Secondary | ICD-10-CM | POA: Diagnosis not present

## 2018-03-09 DIAGNOSIS — D225 Melanocytic nevi of trunk: Secondary | ICD-10-CM | POA: Diagnosis not present

## 2018-03-09 DIAGNOSIS — Z08 Encounter for follow-up examination after completed treatment for malignant neoplasm: Secondary | ICD-10-CM | POA: Diagnosis not present

## 2018-03-09 DIAGNOSIS — D2261 Melanocytic nevi of right upper limb, including shoulder: Secondary | ICD-10-CM | POA: Diagnosis not present

## 2018-03-23 DIAGNOSIS — Z780 Asymptomatic menopausal state: Secondary | ICD-10-CM | POA: Diagnosis not present

## 2018-03-23 DIAGNOSIS — Z1231 Encounter for screening mammogram for malignant neoplasm of breast: Secondary | ICD-10-CM | POA: Diagnosis not present

## 2018-04-27 DIAGNOSIS — E039 Hypothyroidism, unspecified: Secondary | ICD-10-CM | POA: Diagnosis not present

## 2018-04-27 DIAGNOSIS — Z789 Other specified health status: Secondary | ICD-10-CM | POA: Diagnosis not present

## 2018-04-27 DIAGNOSIS — Z299 Encounter for prophylactic measures, unspecified: Secondary | ICD-10-CM | POA: Diagnosis not present

## 2018-04-27 DIAGNOSIS — M810 Age-related osteoporosis without current pathological fracture: Secondary | ICD-10-CM | POA: Diagnosis not present

## 2018-08-03 DIAGNOSIS — R5383 Other fatigue: Secondary | ICD-10-CM | POA: Diagnosis not present

## 2018-08-03 DIAGNOSIS — Z79899 Other long term (current) drug therapy: Secondary | ICD-10-CM | POA: Diagnosis not present

## 2018-08-03 DIAGNOSIS — Z6823 Body mass index (BMI) 23.0-23.9, adult: Secondary | ICD-10-CM | POA: Diagnosis not present

## 2018-08-03 DIAGNOSIS — E559 Vitamin D deficiency, unspecified: Secondary | ICD-10-CM | POA: Diagnosis not present

## 2018-08-03 DIAGNOSIS — Z7189 Other specified counseling: Secondary | ICD-10-CM | POA: Diagnosis not present

## 2018-08-03 DIAGNOSIS — Z1339 Encounter for screening examination for other mental health and behavioral disorders: Secondary | ICD-10-CM | POA: Diagnosis not present

## 2018-08-03 DIAGNOSIS — Z1331 Encounter for screening for depression: Secondary | ICD-10-CM | POA: Diagnosis not present

## 2018-08-03 DIAGNOSIS — Z299 Encounter for prophylactic measures, unspecified: Secondary | ICD-10-CM | POA: Diagnosis not present

## 2018-08-03 DIAGNOSIS — Z0000 Encounter for general adult medical examination without abnormal findings: Secondary | ICD-10-CM | POA: Diagnosis not present

## 2018-08-03 DIAGNOSIS — E039 Hypothyroidism, unspecified: Secondary | ICD-10-CM | POA: Diagnosis not present

## 2018-08-06 ENCOUNTER — Other Ambulatory Visit: Payer: Self-pay

## 2018-08-13 DIAGNOSIS — M810 Age-related osteoporosis without current pathological fracture: Secondary | ICD-10-CM | POA: Diagnosis not present

## 2018-08-28 DIAGNOSIS — N952 Postmenopausal atrophic vaginitis: Secondary | ICD-10-CM | POA: Diagnosis not present

## 2018-10-22 DIAGNOSIS — D123 Benign neoplasm of transverse colon: Secondary | ICD-10-CM | POA: Diagnosis not present

## 2018-10-22 DIAGNOSIS — Z86010 Personal history of colonic polyps: Secondary | ICD-10-CM | POA: Diagnosis not present

## 2019-02-06 DIAGNOSIS — Z23 Encounter for immunization: Secondary | ICD-10-CM | POA: Diagnosis not present

## 2019-02-10 DIAGNOSIS — Z299 Encounter for prophylactic measures, unspecified: Secondary | ICD-10-CM | POA: Diagnosis not present

## 2019-02-10 DIAGNOSIS — Z789 Other specified health status: Secondary | ICD-10-CM | POA: Diagnosis not present

## 2019-02-10 DIAGNOSIS — E039 Hypothyroidism, unspecified: Secondary | ICD-10-CM | POA: Diagnosis not present

## 2019-02-10 DIAGNOSIS — M810 Age-related osteoporosis without current pathological fracture: Secondary | ICD-10-CM | POA: Diagnosis not present

## 2019-02-10 DIAGNOSIS — Z6824 Body mass index (BMI) 24.0-24.9, adult: Secondary | ICD-10-CM | POA: Diagnosis not present

## 2019-02-18 DIAGNOSIS — M810 Age-related osteoporosis without current pathological fracture: Secondary | ICD-10-CM | POA: Diagnosis not present

## 2019-02-27 DIAGNOSIS — Z23 Encounter for immunization: Secondary | ICD-10-CM | POA: Diagnosis not present

## 2019-03-11 DIAGNOSIS — D225 Melanocytic nevi of trunk: Secondary | ICD-10-CM | POA: Diagnosis not present

## 2019-03-11 DIAGNOSIS — D2262 Melanocytic nevi of left upper limb, including shoulder: Secondary | ICD-10-CM | POA: Diagnosis not present

## 2019-03-11 DIAGNOSIS — D2261 Melanocytic nevi of right upper limb, including shoulder: Secondary | ICD-10-CM | POA: Diagnosis not present

## 2019-03-11 DIAGNOSIS — L814 Other melanin hyperpigmentation: Secondary | ICD-10-CM | POA: Diagnosis not present

## 2019-03-11 DIAGNOSIS — D2272 Melanocytic nevi of left lower limb, including hip: Secondary | ICD-10-CM | POA: Diagnosis not present

## 2019-03-11 DIAGNOSIS — L821 Other seborrheic keratosis: Secondary | ICD-10-CM | POA: Diagnosis not present

## 2019-03-11 DIAGNOSIS — D2271 Melanocytic nevi of right lower limb, including hip: Secondary | ICD-10-CM | POA: Diagnosis not present

## 2019-03-11 DIAGNOSIS — Z872 Personal history of diseases of the skin and subcutaneous tissue: Secondary | ICD-10-CM | POA: Diagnosis not present

## 2019-03-11 DIAGNOSIS — Z08 Encounter for follow-up examination after completed treatment for malignant neoplasm: Secondary | ICD-10-CM | POA: Diagnosis not present

## 2019-05-28 DIAGNOSIS — Z1231 Encounter for screening mammogram for malignant neoplasm of breast: Secondary | ICD-10-CM | POA: Diagnosis not present

## 2019-08-02 DIAGNOSIS — B009 Herpesviral infection, unspecified: Secondary | ICD-10-CM | POA: Diagnosis not present

## 2019-08-02 DIAGNOSIS — Z299 Encounter for prophylactic measures, unspecified: Secondary | ICD-10-CM | POA: Diagnosis not present

## 2019-08-02 DIAGNOSIS — D492 Neoplasm of unspecified behavior of bone, soft tissue, and skin: Secondary | ICD-10-CM | POA: Diagnosis not present

## 2019-08-02 DIAGNOSIS — L03119 Cellulitis of unspecified part of limb: Secondary | ICD-10-CM | POA: Diagnosis not present

## 2019-08-03 DIAGNOSIS — L821 Other seborrheic keratosis: Secondary | ICD-10-CM | POA: Diagnosis not present

## 2019-08-03 DIAGNOSIS — D485 Neoplasm of uncertain behavior of skin: Secondary | ICD-10-CM | POA: Diagnosis not present

## 2019-08-12 DIAGNOSIS — Z7189 Other specified counseling: Secondary | ICD-10-CM | POA: Diagnosis not present

## 2019-08-12 DIAGNOSIS — Z6823 Body mass index (BMI) 23.0-23.9, adult: Secondary | ICD-10-CM | POA: Diagnosis not present

## 2019-08-12 DIAGNOSIS — Z79899 Other long term (current) drug therapy: Secondary | ICD-10-CM | POA: Diagnosis not present

## 2019-08-12 DIAGNOSIS — Z0000 Encounter for general adult medical examination without abnormal findings: Secondary | ICD-10-CM | POA: Diagnosis not present

## 2019-08-12 DIAGNOSIS — R5383 Other fatigue: Secondary | ICD-10-CM | POA: Diagnosis not present

## 2019-08-12 DIAGNOSIS — E039 Hypothyroidism, unspecified: Secondary | ICD-10-CM | POA: Diagnosis not present

## 2019-08-12 DIAGNOSIS — Z1331 Encounter for screening for depression: Secondary | ICD-10-CM | POA: Diagnosis not present

## 2019-08-12 DIAGNOSIS — Z1339 Encounter for screening examination for other mental health and behavioral disorders: Secondary | ICD-10-CM | POA: Diagnosis not present

## 2019-08-12 DIAGNOSIS — Z299 Encounter for prophylactic measures, unspecified: Secondary | ICD-10-CM | POA: Diagnosis not present

## 2019-08-20 DIAGNOSIS — Z299 Encounter for prophylactic measures, unspecified: Secondary | ICD-10-CM | POA: Diagnosis not present

## 2019-08-20 DIAGNOSIS — E039 Hypothyroidism, unspecified: Secondary | ICD-10-CM | POA: Diagnosis not present

## 2019-08-20 DIAGNOSIS — R21 Rash and other nonspecific skin eruption: Secondary | ICD-10-CM | POA: Diagnosis not present

## 2019-08-26 DIAGNOSIS — M810 Age-related osteoporosis without current pathological fracture: Secondary | ICD-10-CM | POA: Diagnosis not present

## 2019-09-03 DIAGNOSIS — Z299 Encounter for prophylactic measures, unspecified: Secondary | ICD-10-CM | POA: Diagnosis not present

## 2019-09-03 DIAGNOSIS — H6691 Otitis media, unspecified, right ear: Secondary | ICD-10-CM | POA: Diagnosis not present

## 2019-09-03 DIAGNOSIS — B009 Herpesviral infection, unspecified: Secondary | ICD-10-CM | POA: Diagnosis not present

## 2019-09-03 DIAGNOSIS — M810 Age-related osteoporosis without current pathological fracture: Secondary | ICD-10-CM | POA: Diagnosis not present

## 2019-10-06 ENCOUNTER — Ambulatory Visit: Payer: Medicare Other | Admitting: Family Medicine

## 2020-02-17 DIAGNOSIS — Z299 Encounter for prophylactic measures, unspecified: Secondary | ICD-10-CM | POA: Diagnosis not present

## 2020-02-17 DIAGNOSIS — Z2821 Immunization not carried out because of patient refusal: Secondary | ICD-10-CM | POA: Diagnosis not present

## 2020-02-17 DIAGNOSIS — E039 Hypothyroidism, unspecified: Secondary | ICD-10-CM | POA: Diagnosis not present

## 2020-02-17 DIAGNOSIS — Z789 Other specified health status: Secondary | ICD-10-CM | POA: Diagnosis not present

## 2020-03-06 DIAGNOSIS — M810 Age-related osteoporosis without current pathological fracture: Secondary | ICD-10-CM | POA: Diagnosis not present

## 2020-03-13 DIAGNOSIS — L821 Other seborrheic keratosis: Secondary | ICD-10-CM | POA: Diagnosis not present

## 2020-03-13 DIAGNOSIS — D225 Melanocytic nevi of trunk: Secondary | ICD-10-CM | POA: Diagnosis not present

## 2020-03-13 DIAGNOSIS — Z08 Encounter for follow-up examination after completed treatment for malignant neoplasm: Secondary | ICD-10-CM | POA: Diagnosis not present

## 2020-03-13 DIAGNOSIS — L814 Other melanin hyperpigmentation: Secondary | ICD-10-CM | POA: Diagnosis not present

## 2020-03-13 DIAGNOSIS — D2262 Melanocytic nevi of left upper limb, including shoulder: Secondary | ICD-10-CM | POA: Diagnosis not present

## 2020-03-13 DIAGNOSIS — Z872 Personal history of diseases of the skin and subcutaneous tissue: Secondary | ICD-10-CM | POA: Diagnosis not present

## 2020-03-13 DIAGNOSIS — D485 Neoplasm of uncertain behavior of skin: Secondary | ICD-10-CM | POA: Diagnosis not present

## 2020-03-13 DIAGNOSIS — D2271 Melanocytic nevi of right lower limb, including hip: Secondary | ICD-10-CM | POA: Diagnosis not present

## 2020-03-13 DIAGNOSIS — D2272 Melanocytic nevi of left lower limb, including hip: Secondary | ICD-10-CM | POA: Diagnosis not present

## 2020-03-13 DIAGNOSIS — L570 Actinic keratosis: Secondary | ICD-10-CM | POA: Diagnosis not present

## 2020-03-13 DIAGNOSIS — D2261 Melanocytic nevi of right upper limb, including shoulder: Secondary | ICD-10-CM | POA: Diagnosis not present

## 2020-03-14 DIAGNOSIS — Z01419 Encounter for gynecological examination (general) (routine) without abnormal findings: Secondary | ICD-10-CM | POA: Diagnosis not present

## 2020-03-14 DIAGNOSIS — N952 Postmenopausal atrophic vaginitis: Secondary | ICD-10-CM | POA: Diagnosis not present

## 2020-04-26 DIAGNOSIS — M8589 Other specified disorders of bone density and structure, multiple sites: Secondary | ICD-10-CM | POA: Diagnosis not present

## 2020-05-31 IMAGING — US US EXTREM LOW VENOUS*L*
1 series · 13 of 24 positions shown · non-contrast
Comparison: None.

CLINICAL DATA: 66-year-old female with a history of swelling



[Series 1: us extrem low venous*left* · 0.06mm/px · 13 of 32 slices shown]
[im 1/32]
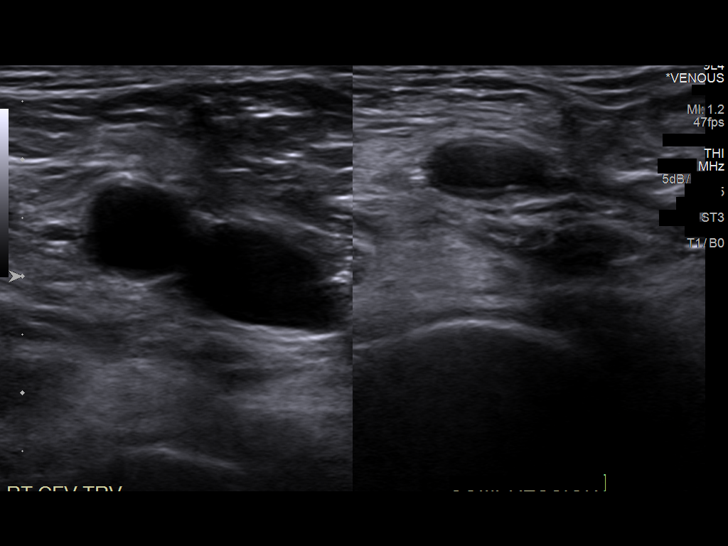
[im 3/32]
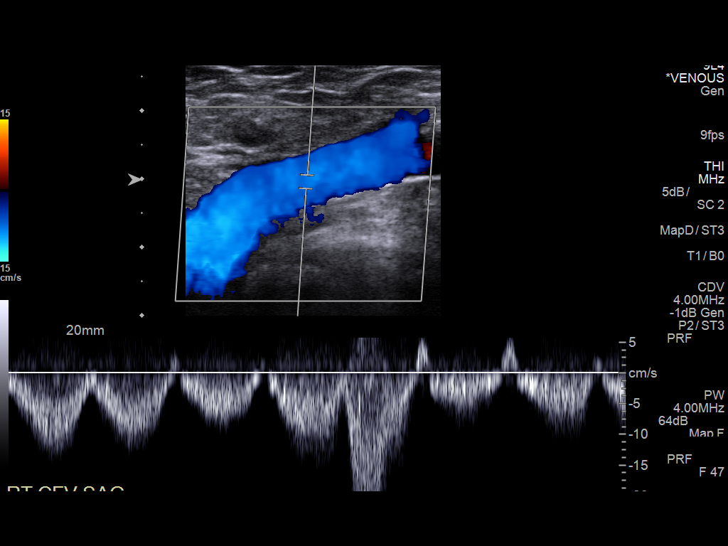
[im 6/32]
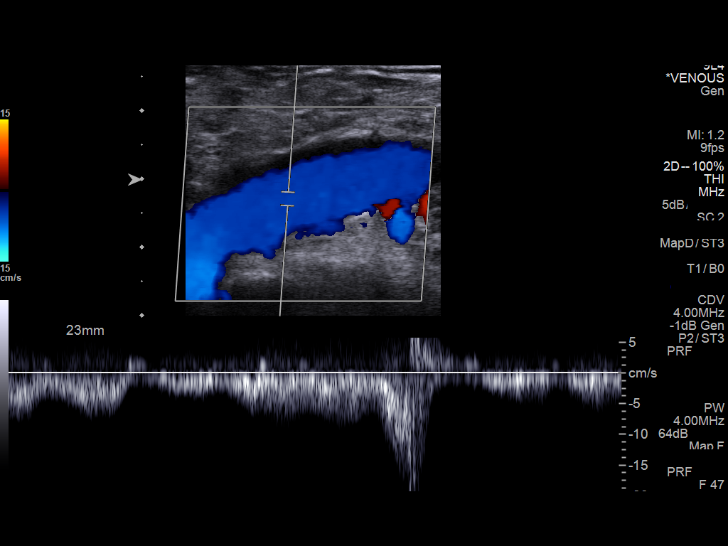
[im 9/32]
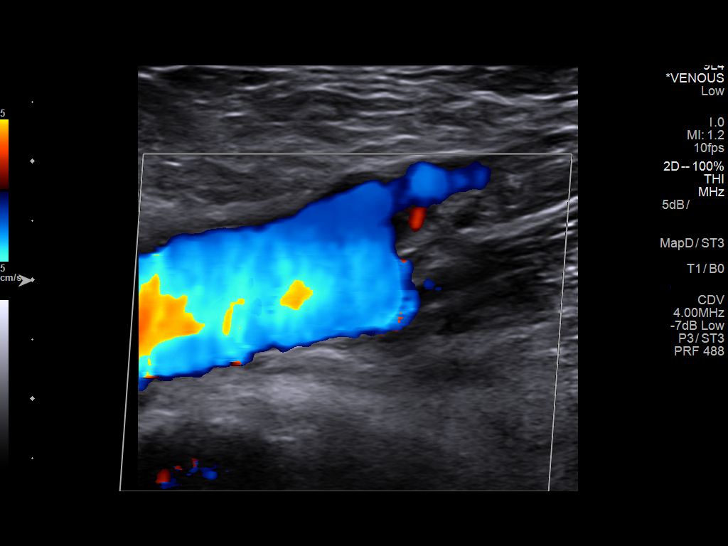
[im 11/32]
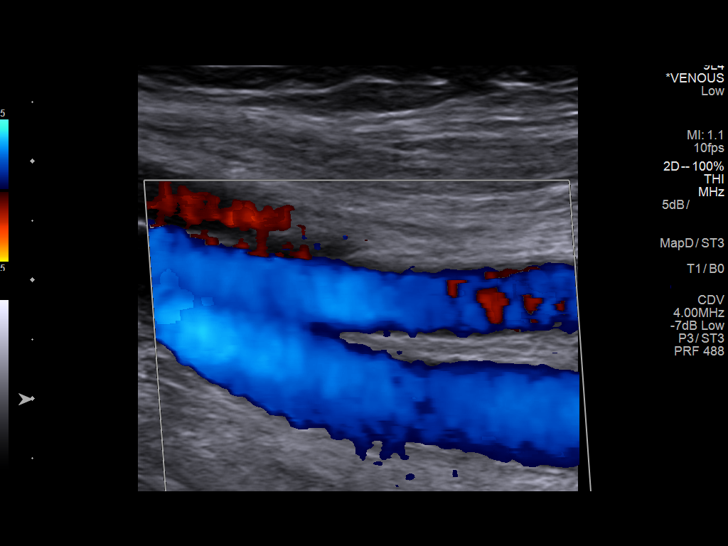
[im 14/32]
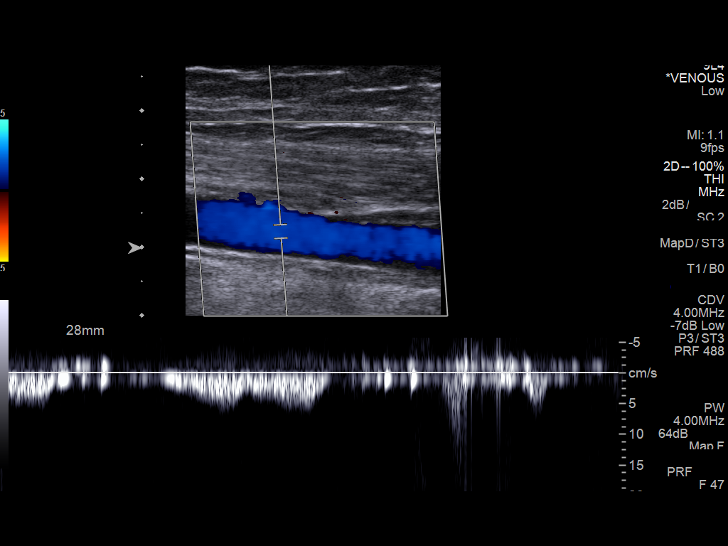
[im 17/32]
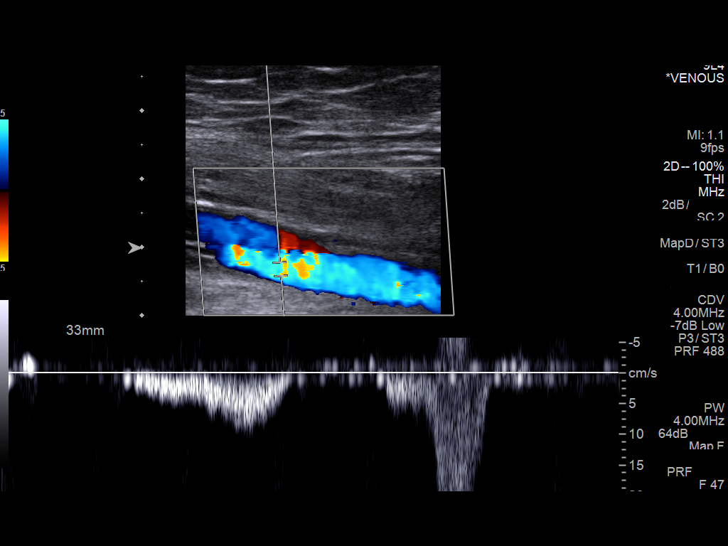
[im 18/32]
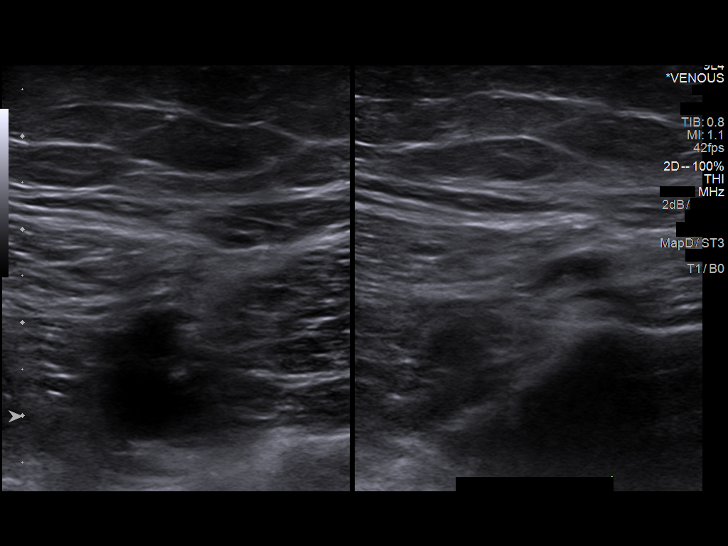
[im 21/32]
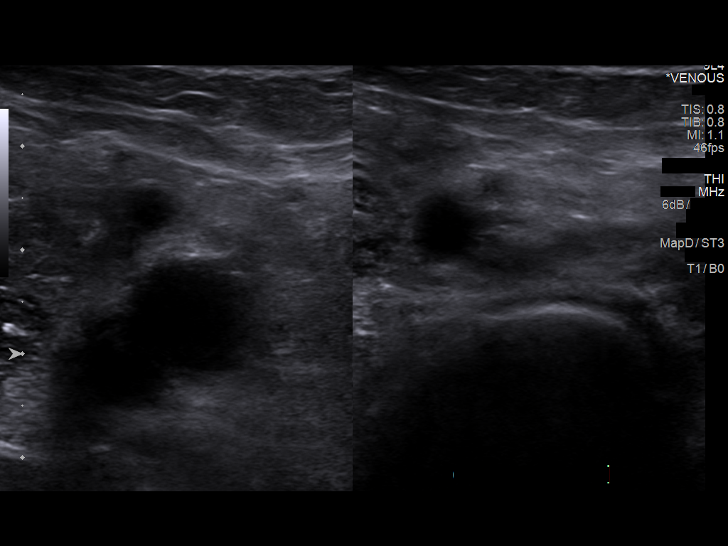
[im 23/32]
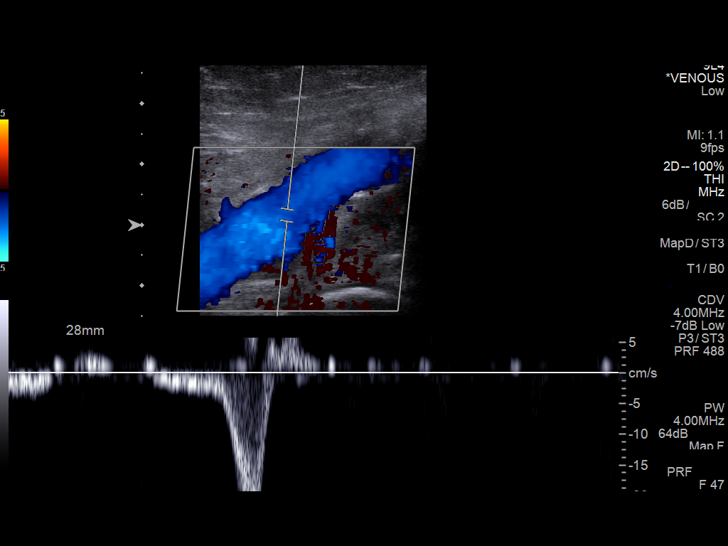
[im 26/32]
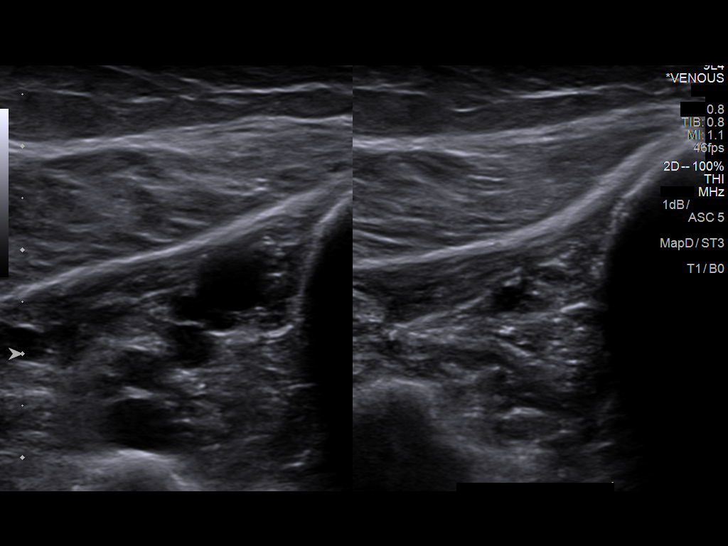
[im 29/32]
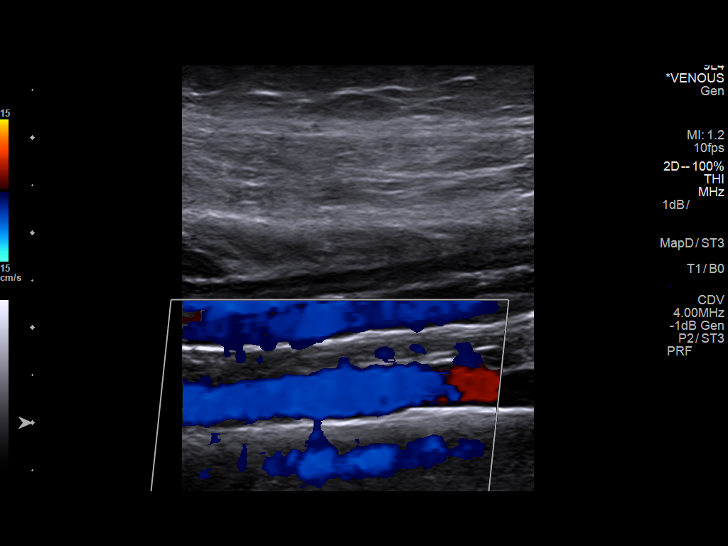
[im 32/32]
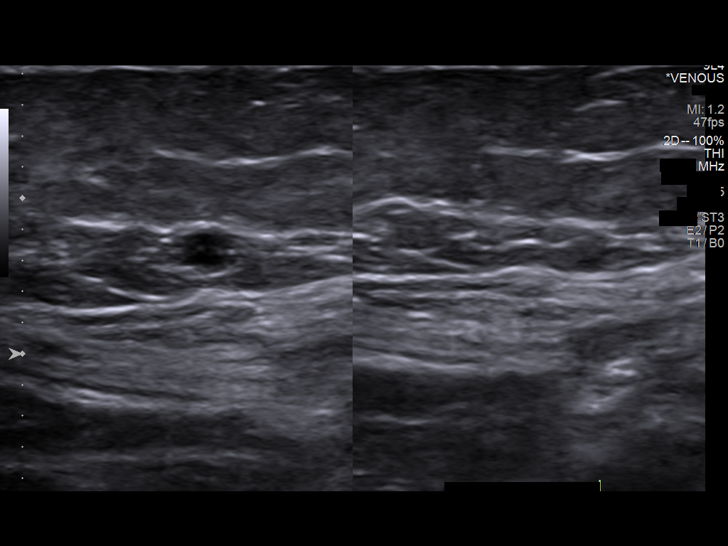

[13 of 24 positions shown; findings below may reference images not displayed]

FINDINGS: Contralateral Common Femoral Vein: Respiratory phasicity is normal
and symmetric with the symptomatic side. No evidence of thrombus.
Normal compressibility.

Common Femoral Vein: No evidence of thrombus. Normal
compressibility, respiratory phasicity and response to augmentation.

Saphenofemoral Junction: No evidence of thrombus. Normal
compressibility and flow on color Doppler imaging.

Profunda Femoral Vein: No evidence of thrombus. Normal
compressibility and flow on color Doppler imaging.

Femoral Vein: No evidence of thrombus. Normal compressibility,
respiratory phasicity and response to augmentation.

Popliteal Vein: No evidence of thrombus. Normal compressibility,
respiratory phasicity and response to augmentation.

Calf Veins: No evidence of thrombus. Normal compressibility and flow
on color Doppler imaging.

Superficial Great Saphenous Vein: No evidence of thrombus. Normal
compressibility and flow on color Doppler imaging.

Other Findings:  None.
IMPRESSION: Sonographic survey of the left lower extremity negative for DVT.

## 2020-08-15 DIAGNOSIS — Z23 Encounter for immunization: Secondary | ICD-10-CM | POA: Diagnosis not present

## 2020-08-28 DIAGNOSIS — Z1231 Encounter for screening mammogram for malignant neoplasm of breast: Secondary | ICD-10-CM | POA: Diagnosis not present

## 2020-09-08 DIAGNOSIS — Z1331 Encounter for screening for depression: Secondary | ICD-10-CM | POA: Diagnosis not present

## 2020-09-08 DIAGNOSIS — M810 Age-related osteoporosis without current pathological fracture: Secondary | ICD-10-CM | POA: Diagnosis not present

## 2020-09-08 DIAGNOSIS — Z1339 Encounter for screening examination for other mental health and behavioral disorders: Secondary | ICD-10-CM | POA: Diagnosis not present

## 2020-09-08 DIAGNOSIS — Z7189 Other specified counseling: Secondary | ICD-10-CM | POA: Diagnosis not present

## 2020-09-08 DIAGNOSIS — E039 Hypothyroidism, unspecified: Secondary | ICD-10-CM | POA: Diagnosis not present

## 2020-09-08 DIAGNOSIS — Z79899 Other long term (current) drug therapy: Secondary | ICD-10-CM | POA: Diagnosis not present

## 2020-09-08 DIAGNOSIS — R5383 Other fatigue: Secondary | ICD-10-CM | POA: Diagnosis not present

## 2020-09-08 DIAGNOSIS — Z789 Other specified health status: Secondary | ICD-10-CM | POA: Diagnosis not present

## 2020-09-08 DIAGNOSIS — Z299 Encounter for prophylactic measures, unspecified: Secondary | ICD-10-CM | POA: Diagnosis not present

## 2020-09-08 DIAGNOSIS — Z0000 Encounter for general adult medical examination without abnormal findings: Secondary | ICD-10-CM | POA: Diagnosis not present

## 2020-09-08 DIAGNOSIS — Z6823 Body mass index (BMI) 23.0-23.9, adult: Secondary | ICD-10-CM | POA: Diagnosis not present

## 2020-12-23 DIAGNOSIS — Z23 Encounter for immunization: Secondary | ICD-10-CM | POA: Diagnosis not present

## 2020-12-28 DIAGNOSIS — R21 Rash and other nonspecific skin eruption: Secondary | ICD-10-CM | POA: Diagnosis not present

## 2020-12-28 DIAGNOSIS — D696 Thrombocytopenia, unspecified: Secondary | ICD-10-CM | POA: Diagnosis not present

## 2020-12-28 DIAGNOSIS — Z299 Encounter for prophylactic measures, unspecified: Secondary | ICD-10-CM | POA: Diagnosis not present

## 2020-12-28 DIAGNOSIS — B029 Zoster without complications: Secondary | ICD-10-CM | POA: Diagnosis not present

## 2020-12-28 DIAGNOSIS — Z6824 Body mass index (BMI) 24.0-24.9, adult: Secondary | ICD-10-CM | POA: Diagnosis not present

## 2021-01-23 DIAGNOSIS — E785 Hyperlipidemia, unspecified: Secondary | ICD-10-CM | POA: Diagnosis not present

## 2021-01-23 DIAGNOSIS — Z23 Encounter for immunization: Secondary | ICD-10-CM | POA: Diagnosis not present

## 2021-01-23 DIAGNOSIS — E039 Hypothyroidism, unspecified: Secondary | ICD-10-CM | POA: Diagnosis not present

## 2021-01-23 DIAGNOSIS — I10 Essential (primary) hypertension: Secondary | ICD-10-CM | POA: Diagnosis not present

## 2021-01-23 DIAGNOSIS — E559 Vitamin D deficiency, unspecified: Secondary | ICD-10-CM | POA: Diagnosis not present

## 2021-01-23 DIAGNOSIS — M810 Age-related osteoporosis without current pathological fracture: Secondary | ICD-10-CM | POA: Diagnosis not present

## 2021-01-23 DIAGNOSIS — Z7189 Other specified counseling: Secondary | ICD-10-CM | POA: Diagnosis not present

## 2022-06-11 NOTE — Progress Notes (Unsigned)
   Rubin Payor, PhD, LAT, ATC acting as a scribe for Clementeen Graham, MD.  Christine Wolfe is a 72 y.o. female who presents to [facility] today for R foot pain x ***. MOI***. Pt locates pain to ***  R foot swelling: Aggravates: Treatments tried:  Pertinent review of systems: ***  Relevant historical information: ***   Exam:  There were no vitals taken for this visit. General: Well Developed, well nourished, and in no acute distress.   MSK: ***    Lab and Radiology Results No results found for this or any previous visit (from the past 72 hour(s)). No results found.     Assessment and Plan: 72 y.o. female with ***   PDMP not reviewed this encounter. No orders of the defined types were placed in this encounter.  No orders of the defined types were placed in this encounter.    Discussed warning signs or symptoms. Please see discharge instructions. Patient expresses understanding.   ***

## 2022-06-12 ENCOUNTER — Ambulatory Visit: Payer: Medicare HMO | Admitting: Family Medicine

## 2022-06-12 ENCOUNTER — Encounter: Payer: Self-pay | Admitting: Family Medicine

## 2022-06-12 ENCOUNTER — Ambulatory Visit (INDEPENDENT_AMBULATORY_CARE_PROVIDER_SITE_OTHER): Payer: Medicare HMO

## 2022-06-12 VITALS — BP 110/78 | HR 63 | Ht 64.0 in | Wt 141.0 lb

## 2022-06-12 DIAGNOSIS — M79671 Pain in right foot: Secondary | ICD-10-CM | POA: Diagnosis not present

## 2022-06-12 NOTE — Patient Instructions (Addendum)
Thank you for coming in today.   I've referred you to Physical Therapy.  Let us know if you don't hear from them in one week.   ASO ankle brace.   Ok to also use the body helix full ankle sleeve.   If not ok you can use a cam walker boot.   Keep me updated.   Let me know where you wand up so I could do PT order to there.

## 2022-06-19 NOTE — Progress Notes (Signed)
Right foot x-ray again shows an avulsion fracture at the outside part of the foot.  Again this will act like an ankle sprain.

## 2022-06-19 NOTE — Progress Notes (Signed)
Right ankle x-ray shows an avulsion fracture which could be old or could be new it is hard to tell based on the way the x-ray looks.  This will act like an ankle sprain.  Physical therapy is appropriate.
